# Patient Record
Sex: Male | Born: 1995 | Race: White | Hispanic: No | Marital: Single | Smoking: Never smoker
Health system: Southern US, Community
[De-identification: ages and names within clinical notes are randomized; demographics above are authoritative.]

---
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
## 2014-01-01 ENCOUNTER — Encounter: Payer: Self-pay | Admitting: Orthopedic Surgery

## 2014-01-01 ENCOUNTER — Ambulatory Visit (INDEPENDENT_AMBULATORY_CARE_PROVIDER_SITE_OTHER): Admitting: Orthopedic Surgery

## 2014-01-01 DIAGNOSIS — G5622 Lesion of ulnar nerve, left upper limb: Secondary | ICD-10-CM

## 2014-01-01 DIAGNOSIS — M7700 Medial epicondylitis, unspecified elbow: Secondary | ICD-10-CM

## 2014-01-01 DIAGNOSIS — IMO0002 Reserved for concepts with insufficient information to code with codable children: Secondary | ICD-10-CM

## 2014-01-01 DIAGNOSIS — S46912A Strain of unspecified muscle, fascia and tendon at shoulder and upper arm level, left arm, initial encounter: Secondary | ICD-10-CM

## 2014-01-01 DIAGNOSIS — M25529 Pain in unspecified elbow: Secondary | ICD-10-CM

## 2014-01-01 DIAGNOSIS — M5412 Radiculopathy, cervical region: Secondary | ICD-10-CM

## 2014-01-01 NOTE — Patient Instructions (Signed)
Remove sling  Continue gabapentin  Continue steroids until finished

## 2014-01-01 NOTE — Progress Notes (Signed)
Patient ID: Ronald Padilla, male   DOB: 04/06/1996, 18 y.o.   MRN: [id number]  No chief complaint on file.   Second visit status post injury left elbow  Patient currently on gabapentin steroids oral Dosepak tramadol and Vicodin as needed  He reports improvement in symptoms decreased swelling increased range of motion in his hand and elbow. Decreased pain in the elbow. He still has some slight tingling in the small and ring finger  He is anxious to pertussis plate and a family golf tournament this weekend  Review of systems negative  Exam well-developed well-nourished male grooming and hygiene intact alert and oriented x3 mood and affect normal elbow flexion has returned to normal he has a 5 lack of extension is decreased swelling along the medial epicondyle some tenderness over the ulnar nerve proximal to the cubital tunnel full range of motion in the hand and wrist. His grip strength has improved his skin ecchymosis is better as well. He has good radial and ulnar pulse  Impression resolving injury left elbow Encounter Diagnoses  Name Primary?  . Elbow pain Yes  . Neuritis of left ulnar nerve   . Epicondylitis elbow, medial   . Strain of elbow, left     Current medications will include gabapentin 100 mg 3 times a day, complete steroid Dosepak. Is not really taking any tramadol Vicodin at this time  Followup one week or if he may remove the sling. Okay to try to participate in the golf tournament.

## 2014-01-08 ENCOUNTER — Encounter: Payer: Self-pay | Admitting: Orthopedic Surgery

## 2014-01-08 ENCOUNTER — Ambulatory Visit (INDEPENDENT_AMBULATORY_CARE_PROVIDER_SITE_OTHER): Admitting: Orthopedic Surgery

## 2014-01-08 VITALS — BP 113/71 | Ht 74.0 in | Wt 184.0 lb

## 2014-01-08 DIAGNOSIS — M7700 Medial epicondylitis, unspecified elbow: Secondary | ICD-10-CM

## 2014-01-08 DIAGNOSIS — G5622 Lesion of ulnar nerve, left upper limb: Secondary | ICD-10-CM

## 2014-01-08 DIAGNOSIS — S56912A Strain of unspecified muscles, fascia and tendons at forearm level, left arm, initial encounter: Secondary | ICD-10-CM

## 2014-01-08 DIAGNOSIS — M25529 Pain in unspecified elbow: Secondary | ICD-10-CM

## 2014-01-08 DIAGNOSIS — M5412 Radiculopathy, cervical region: Secondary | ICD-10-CM

## 2014-01-08 DIAGNOSIS — IMO0002 Reserved for concepts with insufficient information to code with codable children: Secondary | ICD-10-CM

## 2014-01-08 DIAGNOSIS — S46912A Strain of unspecified muscle, fascia and tendon at shoulder and upper arm level, left arm, initial encounter: Secondary | ICD-10-CM

## 2014-01-08 NOTE — Progress Notes (Signed)
Patient ID: Ronald Padilla, male   DOB: 12/06/1995, 18 y.o.   MRN: [id number] Chief Complaint  Patient presents with  . Follow-up    1 week recheck on left elbow.   Encounter Diagnoses  Name Primary?  . Elbow pain Yes  . Neuritis of left ulnar nerve   . Epicondylitis elbow, medial   . Strain of elbow, left     The patient reports improvement in his overall symptoms with no evidence of numbness or tingling he still has some pain around the medial aspect of the elbow. He did complete the family got tournament. He has full range of motion now with a normal neurologic and vascular status to the limb. Full range of motion without pain. Elbow remained stable.  Recommend stopping the Neurontin, continue ibuprofen as needed. He finished his steroids.  Follow up as needed

## 2014-01-08 NOTE — Patient Instructions (Signed)
activities as tolerated 

## 2014-07-17 ENCOUNTER — Ambulatory Visit: Admitting: Orthopedic Surgery

## 2014-07-17 ENCOUNTER — Telehealth: Payer: Self-pay | Admitting: Orthopedic Surgery

## 2014-07-17 NOTE — Telephone Encounter (Signed)
Called back to patient's dad to confirm appointment, and to remind about Xray as discussed, to have prior to the work-in appointment for today, 07/17/14 -- had left a voice message early this morning, 9:20a.m, and also called back at 3:50pm. Dad states needs to cancel appointment for today, and said he had thought his wife had called to notify us of the need to cancel.  I offered re-schedule, and he states he or his wife will call back to re-schedule.  I also notified referring primary care, Dr. Janna Archondiego, of the appointment status.

## 2014-09-06 ENCOUNTER — Encounter: Payer: Self-pay | Admitting: Orthopedic Surgery

## 2014-09-06 ENCOUNTER — Ambulatory Visit (INDEPENDENT_AMBULATORY_CARE_PROVIDER_SITE_OTHER)

## 2014-09-06 ENCOUNTER — Ambulatory Visit (INDEPENDENT_AMBULATORY_CARE_PROVIDER_SITE_OTHER): Admitting: Orthopedic Surgery

## 2014-09-06 VITALS — BP 115/73 | Ht 74.0 in | Wt 184.0 lb

## 2014-09-06 DIAGNOSIS — M533 Sacrococcygeal disorders, not elsewhere classified: Secondary | ICD-10-CM

## 2014-09-06 MED ORDER — INDOMETHACIN 25 MG PO CAPS: 25.0000 mg | ORAL_CAPSULE | Freq: Three times a day (TID) | ORAL | Status: AC

## 2014-09-06 NOTE — Progress Notes (Signed)
Patient ID: Ronald Padilla, male   DOB: 07/22/1996, 19 y.o.   MRN: [id number] Patient ID: Ronald Padilla, male   DOB: 12/01/1995, 19 y.o.   MRN: [id number]  Chief Complaint  Patient presents with  . Tailbone Pain    tailbone pain x 1 month, sports injury    HPI Ronald Padilla is a 19 y.o. male.  Since with pain over the coccyx area after taking several charges while playing basketball. He said pain for about one month. He describes his pain as 6 out of 10, constant, sharp without radiation. Denies any hematuria HPI  Review of Systems Review of Systems Review of systems just his back hurting. Medical history surgical history negative no allergies no meds.  History reviewed. No pertinent past medical history.  History reviewed. No pertinent past surgical history.  History reviewed. No pertinent family history.  Social History History  Substance Use Topics  . Smoking status: Never Smoker   . Smokeless tobacco: Not on file  . Alcohol Use: No    No Known Allergies  Current Outpatient Prescriptions  Medication Sig Dispense Refill  . celecoxib (CELEBREX) 200 MG capsule Take 200 mg by mouth 2 (two) times daily.    . indomethacin (INDOCIN) 25 MG capsule Take 1 capsule (25 mg total) by mouth 3 (three) times daily with meals. 60 capsule 0   No current facility-administered medications for this visit.       Physical Exam Blood pressure 115/73, height 6\' 2"  (1.88 m), weight 184 lb (83.462 kg). Physical Exam  Constitutional: He is oriented to person, place, and time. He appears well-developed and well-nourished. No distress.  Cardiovascular: Intact distal pulses.   Musculoskeletal:  Overall alignment of the and posture are normal. He does have some tenderness over his coccyx area. Lower extremities are normal upper extremities are normal.  Neurological: He is alert and oriented to person, place, and time. He displays abnormal reflex. He exhibits normal muscle tone. Coordination  normal.  Skin: Skin is warm and dry. No erythema.  Psychiatric: He has a normal mood and affect. His behavior is normal.  Nursing note and vitals reviewed.    Data Reviewed Independent interpretation X-rays are negative for fracture  Assessment    Encounter Diagnosis  Name Primary?  . Tail bone pain Yes        Plan    Recommend Indocin 3 times a day for 3 weeks follow-up as needed any hematuria or numbness he is to call the office immediately.      .Marland Kitchen

## 2014-09-12 ENCOUNTER — Telehealth: Payer: Self-pay | Admitting: Orthopedic Surgery

## 2014-09-12 NOTE — Telephone Encounter (Signed)
Routing to Dr Harrison 

## 2014-09-12 NOTE — Telephone Encounter (Signed)
Patient is coming in stating that his pain medication is not working at all subsiding his pain indomethacin (INDOCIN) 25 MG capsule , he is asking for another alternative please advise?

## 2014-09-13 ENCOUNTER — Other Ambulatory Visit: Payer: Self-pay | Admitting: Orthopedic Surgery

## 2014-09-13 DIAGNOSIS — M533 Sacrococcygeal disorders, not elsewhere classified: Secondary | ICD-10-CM

## 2014-09-13 MED ORDER — ACETAMINOPHEN-CODEINE 300-30 MG PO TABS: 1.0000 | ORAL_TABLET | ORAL | Status: AC | PRN

## 2014-09-13 NOTE — Telephone Encounter (Signed)
Reached patient; patient picked up prescription today.

## 2014-09-13 NOTE — Telephone Encounter (Signed)
New prescription written, called patient, no answer

## 2014-12-24 IMAGING — CR DG KNEE COMPLETE 4+V*L*
4 series · 4 of 4 positions shown · non-contrast
Comparison: None.

CLINICAL DATA: Injured left knee while playing basketball earlier
today.

EXAM:
LEFT KNEE - COMPLETE 4+ VIEW

[view not recorded (1 of 4)]
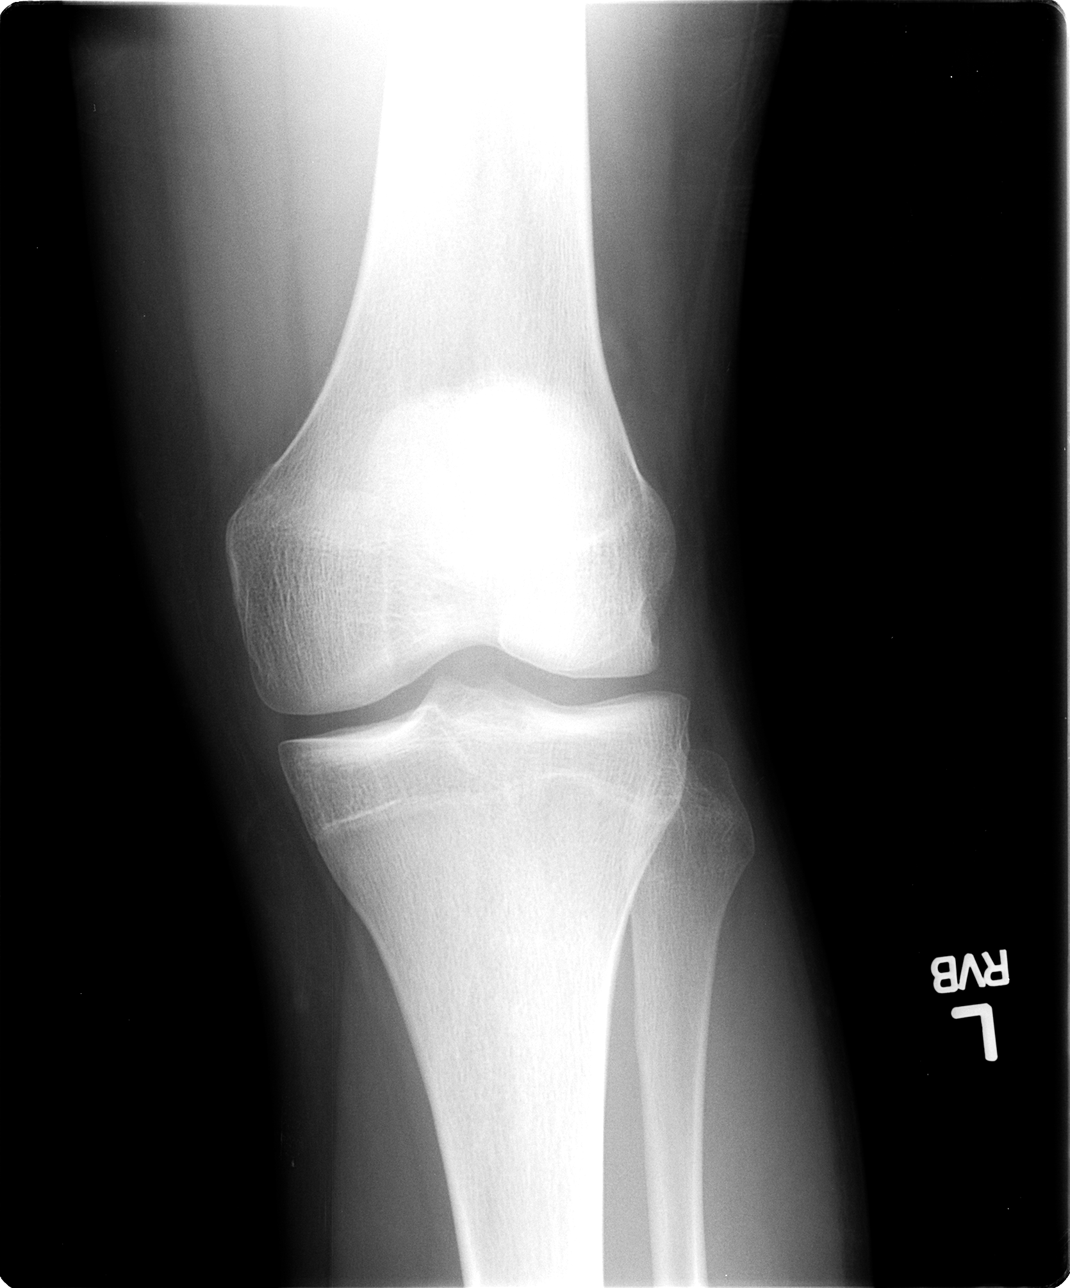

[view not recorded (2 of 4)]
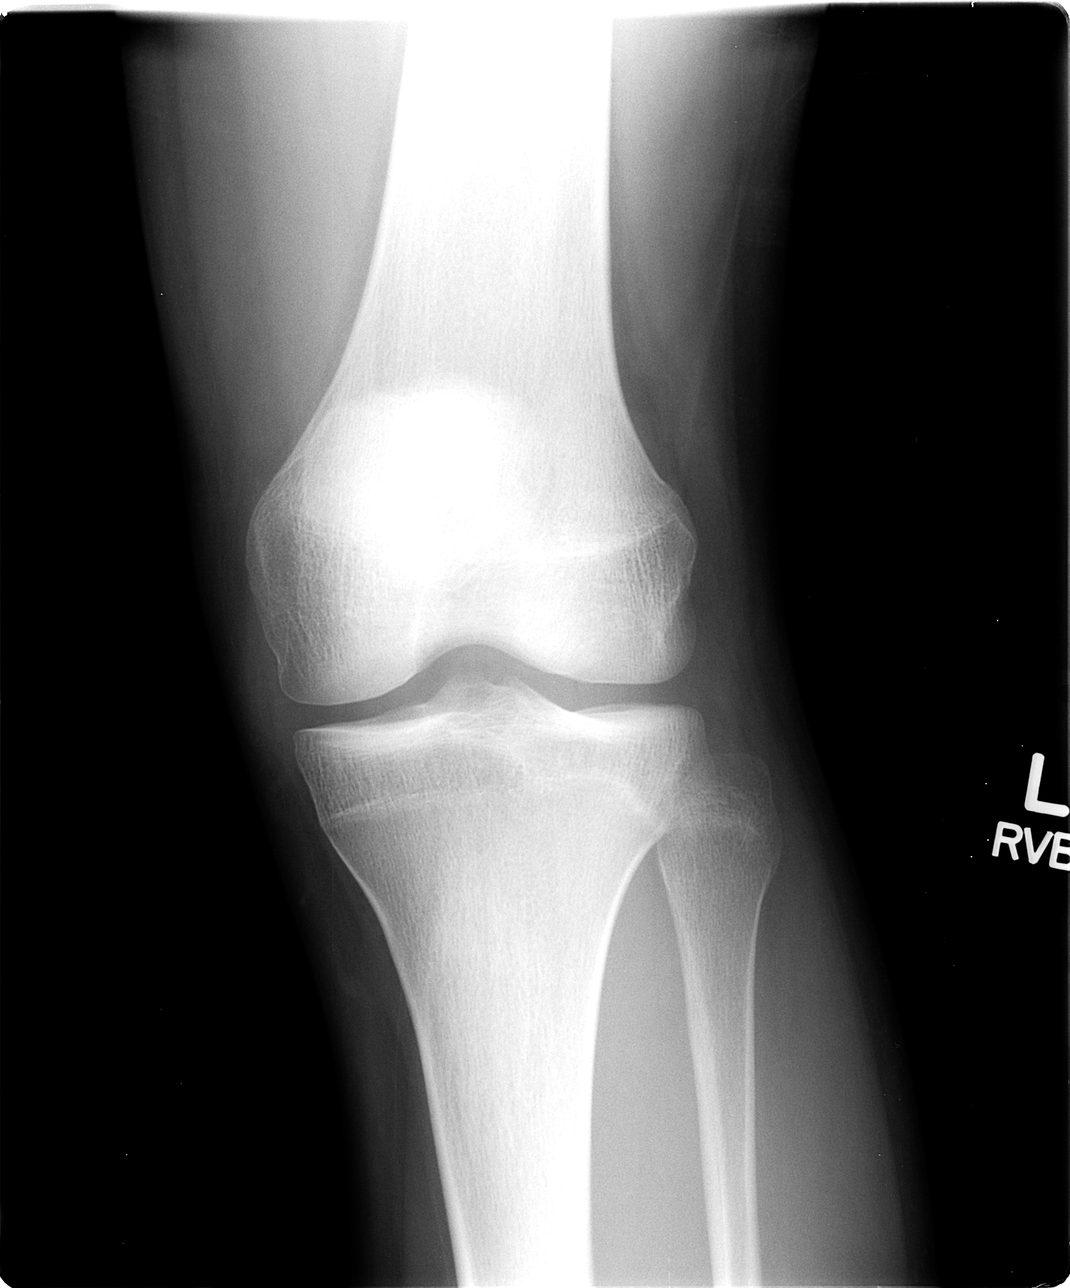

[view not recorded (3 of 4)]
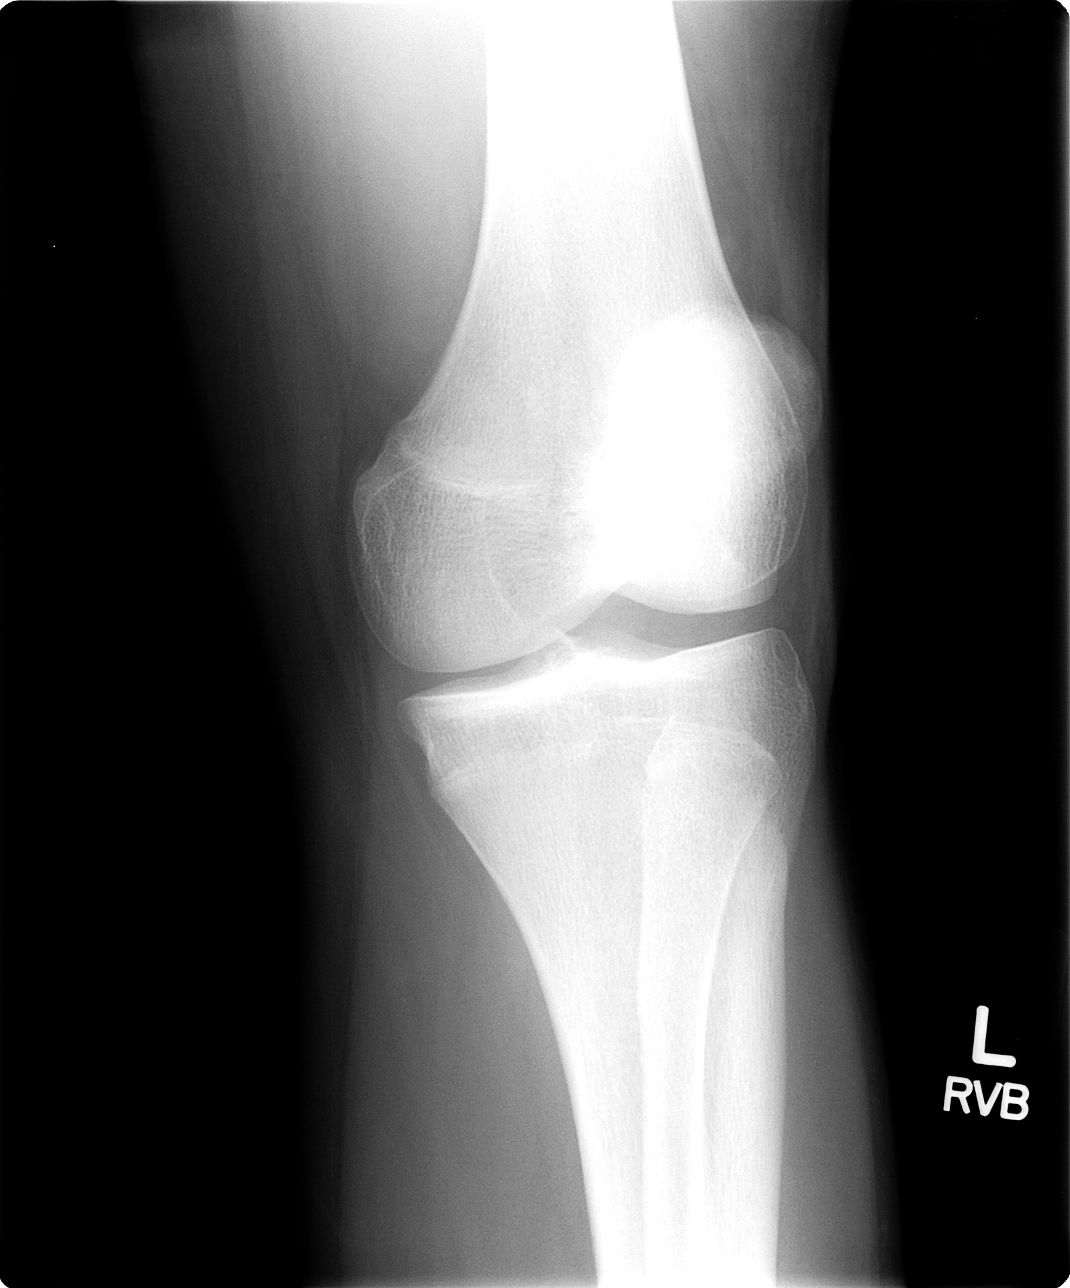

[view not recorded (4 of 4)]
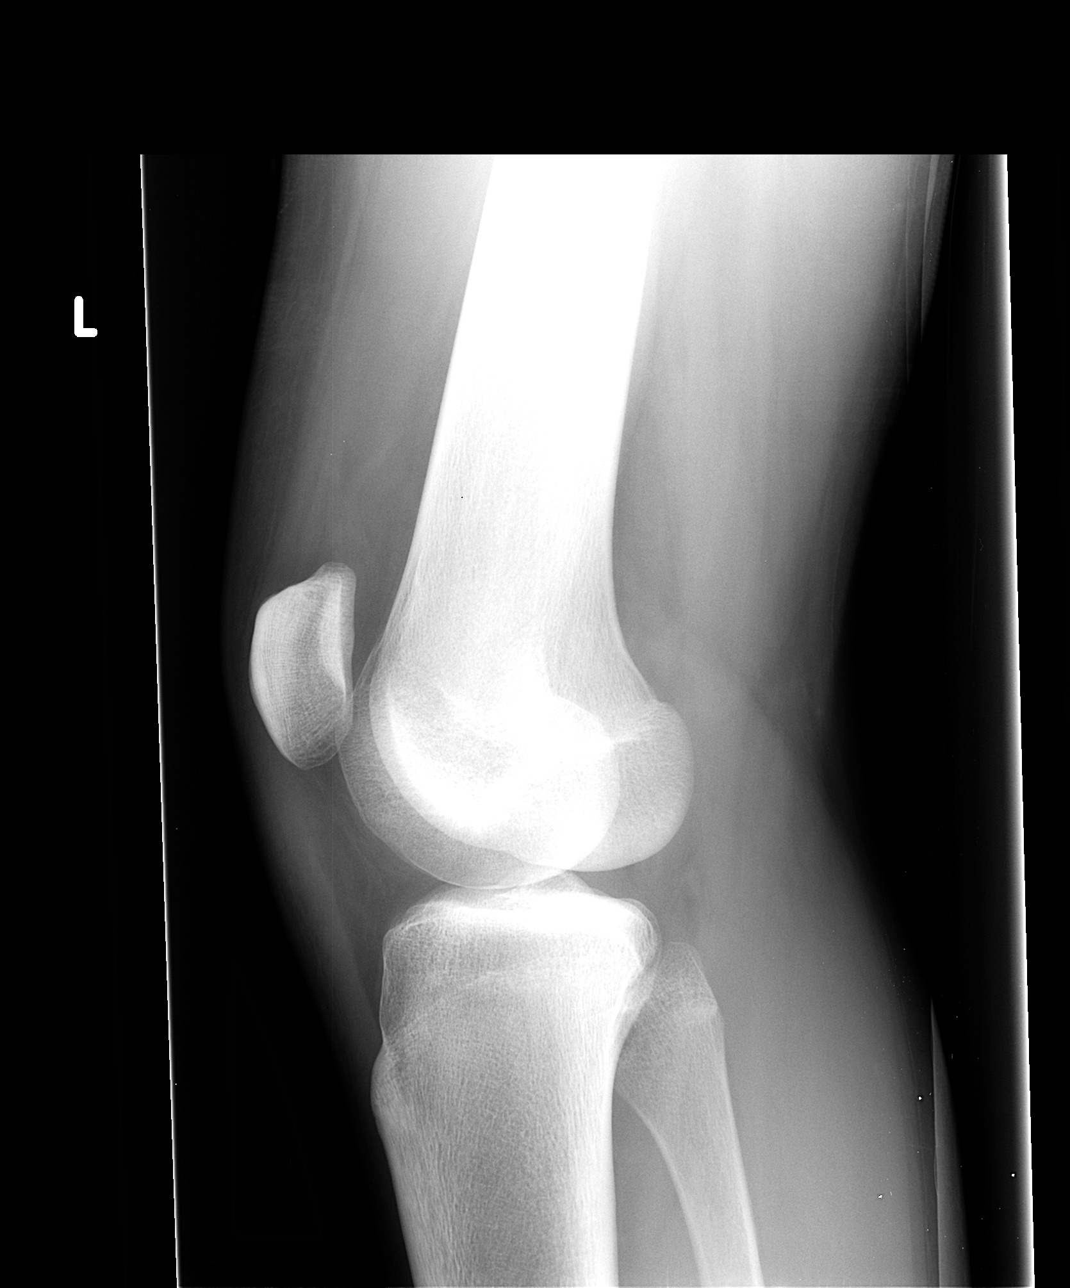

[4 of 4 positions shown; findings below may reference images not displayed]

FINDINGS: No evidence of acute fracture or dislocation. Well-preserved joint
spaces. Well-preserved bone mineral density. No intrinsic osseous
abnormality. No visible joint effusion.
IMPRESSION: Normal examination.
